# Patient Record
Sex: Female | Born: 1950 | Race: White | Hispanic: No | Marital: Married | Smoking: Never smoker
Health system: Southern US, Community
[De-identification: ages and names within clinical notes are randomized; demographics above are authoritative.]

## PROBLEM LIST (undated history)

## (undated) DIAGNOSIS — E119 Type 2 diabetes mellitus without complications: Secondary | ICD-10-CM

## (undated) HISTORY — PX: OTHER SURGICAL HISTORY: SHX169

## (undated) HISTORY — PX: ELBOW SURGERY: SHX618

## (undated) HISTORY — PX: BREAST SURGERY: SHX581

## (undated) HISTORY — PX: BACK SURGERY: SHX140

---

## 2016-03-11 ENCOUNTER — Emergency Department (HOSPITAL_COMMUNITY): Payer: Medicare Other

## 2016-03-11 ENCOUNTER — Emergency Department (HOSPITAL_COMMUNITY)
Admission: EM | Admit: 2016-03-11 | Discharge: 2016-03-11 | Disposition: A | Discharge status: Home / Self Care | Payer: Medicare Other | Attending: Emergency Medicine | Admitting: Emergency Medicine

## 2016-03-11 ENCOUNTER — Encounter (HOSPITAL_COMMUNITY): Payer: Self-pay | Admitting: *Deleted

## 2016-03-11 DIAGNOSIS — S0083XA Contusion of other part of head, initial encounter: Secondary | ICD-10-CM | POA: Diagnosis not present

## 2016-03-11 DIAGNOSIS — Y929 Unspecified place or not applicable: Secondary | ICD-10-CM | POA: Insufficient documentation

## 2016-03-11 DIAGNOSIS — E119 Type 2 diabetes mellitus without complications: Secondary | ICD-10-CM | POA: Diagnosis not present

## 2016-03-11 DIAGNOSIS — Y999 Unspecified external cause status: Secondary | ICD-10-CM | POA: Diagnosis not present

## 2016-03-11 DIAGNOSIS — W109XXA Fall (on) (from) unspecified stairs and steps, initial encounter: Secondary | ICD-10-CM | POA: Diagnosis not present

## 2016-03-11 DIAGNOSIS — S0990XA Unspecified injury of head, initial encounter: Secondary | ICD-10-CM

## 2016-03-11 DIAGNOSIS — Z79899 Other long term (current) drug therapy: Secondary | ICD-10-CM | POA: Diagnosis not present

## 2016-03-11 DIAGNOSIS — S52571A Other intraarticular fracture of lower end of right radius, initial encounter for closed fracture: Secondary | ICD-10-CM

## 2016-03-11 DIAGNOSIS — S0081XA Abrasion of other part of head, initial encounter: Secondary | ICD-10-CM

## 2016-03-11 DIAGNOSIS — W19XXXA Unspecified fall, initial encounter: Secondary | ICD-10-CM

## 2016-03-11 DIAGNOSIS — Y939 Activity, unspecified: Secondary | ICD-10-CM | POA: Insufficient documentation

## 2016-03-11 DIAGNOSIS — Z7984 Long term (current) use of oral hypoglycemic drugs: Secondary | ICD-10-CM | POA: Insufficient documentation

## 2016-03-11 DIAGNOSIS — S6991XA Unspecified injury of right wrist, hand and finger(s), initial encounter: Secondary | ICD-10-CM | POA: Diagnosis present

## 2016-03-11 HISTORY — DX: Type 2 diabetes mellitus without complications: E11.9

## 2016-03-11 MED ORDER — ONDANSETRON HCL 4 MG PO TABS
4.0000 mg | ORAL_TABLET | Freq: Once | ORAL | Status: AC
  Administered 2016-03-11: 4 mg via ORAL
  Filled 2016-03-11: qty 1

## 2016-03-11 MED ORDER — OXYCODONE-ACETAMINOPHEN 5-325 MG PO TABS
1.0000 | ORAL_TABLET | Freq: Once | ORAL | Status: AC
  Administered 2016-03-11: 1 via ORAL
  Filled 2016-03-11: qty 1

## 2016-03-11 NOTE — ED Provider Notes (Signed)
[facility]-EMERGENCY DEPT Provider Note   CSN: [id number] Arrival date & time: 03/11/16  1830     History   Chief Complaint Chief Complaint  Patient presents with  . Fall  . Head Injury  . Wrist Injury    HPI Heather Greene is a 65 y.o. female.  The history is provided by the patient.  Fall  This is a new problem. The current episode started 2 days ago. The problem has been resolved. Pertinent negatives include no chest pain, no abdominal pain, no headaches and no shortness of breath. Nothing aggravates the symptoms. Nothing relieves the symptoms.   Patient reports falling off a step 7 RV onto her face. No LOC. No anticoagulation. Patient with right periorbital abrasions. Also complaining of neck and right wrist pain.  Past Medical History:  Diagnosis Date  . Diabetes mellitus without complication (HCC)     There are no active problems to display for this patient.   Past Surgical History:  Procedure Laterality Date  . BACK SURGERY    . BREAST SURGERY     lumpectomy  . ELBOW SURGERY    . neck surgeries      OB History    No data available       Home Medications    Prior to Admission medications   Medication Sig Start Date End Date Taking? Authorizing Provider  albuterol (PROAIR HFA) 108 (90 Base) MCG/ACT inhaler Inhale 2 puffs into the lungs every 6 (six) hours as needed for wheezing or shortness of breath.   Yes Historical Provider, MD  Exenatide ER (BYDUREON) 2 MG PEN Inject into the skin every 7 (seven) days. (TUESDAYS)   Yes Historical Provider, MD  fluticasone (FLONASE) 50 MCG/ACT nasal spray Place 2 sprays into both nostrils daily as needed for allergies or rhinitis.   Yes Historical Provider, MD  Fluticasone Propionate, Inhal, (FLOVENT DISKUS) 250 MCG/BLIST AEPB Inhale 2 puffs into the lungs 2 (two) times daily as needed (when symptomatic).   Yes Historical Provider, MD  gabapentin (NEURONTIN) 600 MG tablet Take 600 mg by mouth 2 (two) times daily.   Yes  Historical Provider, MD  glycopyrrolate (ROBINUL) 1 MG tablet Take 1 mg by mouth 2 (two) times daily.   Yes Historical Provider, MD  HYDROcodone-acetaminophen (NORCO) 10-325 MG tablet Take 1 tablet by mouth 2 (two) times daily.   Yes Historical Provider, MD  levothyroxine (SYNTHROID, LEVOTHROID) 175 MCG tablet Take 175 mcg by mouth daily before breakfast.   Yes Historical Provider, MD  Lifitegrast Benay Spice) 5 % SOLN Place 1 drop into both eyes 2 (two) times daily.   Yes Historical Provider, MD  linaclotide (LINZESS) 290 MCG CAPS capsule Take 290 mcg by mouth daily before breakfast.   Yes Historical Provider, MD  metFORMIN (GLUCOPHAGE) 1000 MG tablet Take 1,000 mg by mouth 2 (two) times daily with a meal.   Yes Historical Provider, MD  methadone (DOLOPHINE) 10 MG tablet Take 10 mg by mouth every 12 (twelve) hours.   Yes Historical Provider, MD  metoprolol (LOPRESSOR) 50 MG tablet Take 50 mg by mouth 2 (two) times daily.   Yes Historical Provider, MD  mirabegron ER (MYRBETRIQ) 50 MG TB24 tablet Take 50 mg by mouth 2 (two) times daily.   Yes Historical Provider, MD  omeprazole (PRILOSEC) 40 MG capsule Take 40 mg by mouth daily before breakfast.   Yes Historical Provider, MD  polyethylene glycol (MIRALAX / GLYCOLAX) packet Take 17 g by mouth daily as needed for mild constipation.  Yes Historical Provider, MD  simvastatin (ZOCOR) 40 MG tablet Take 40 mg by mouth at bedtime.   Yes Historical Provider, MD  spironolactone (ALDACTONE) 25 MG tablet Take 25 mg by mouth every morning.    Yes Historical Provider, MD  tamoxifen (NOLVADEX) 10 MG tablet Take 10 mg by mouth 2 (two) times daily.   Yes Historical Provider, MD  trimethoprim (TRIMPEX) 100 MG tablet Take 100 mg by mouth daily.   Yes Historical Provider, MD  venlafaxine XR (EFFEXOR-XR) 150 MG 24 hr capsule Take 150 mg by mouth daily with breakfast.   Yes Historical Provider, MD    Family History No family history on file.  Social History Social  History  Substance Use Topics  . Smoking status: Never Smoker  . Smokeless tobacco: Never Used  . Alcohol use No     Allergies   Codeine; Demerol [meperidine]; Morphine and related; and Tape   Review of Systems Review of Systems  Respiratory: Negative for shortness of breath.   Cardiovascular: Negative for chest pain.  Gastrointestinal: Negative for abdominal pain.  Neurological: Negative for headaches.  Ten systems are reviewed and are negative for acute change except as noted in the HPI   Physical Exam Updated Vital Signs BP 106/82 (BP Location: Left Arm)   Pulse 72   Temp 97.7 F (36.5 C) (Oral)   Resp 18   SpO2 96%   Physical Exam  Constitutional: She is oriented to person, place, and time. She appears well-developed and well-nourished. No distress.  HENT:  Head: Normocephalic. Head is with abrasion and with contusion.    Right Ear: External ear normal.  Left Ear: External ear normal.  Nose: Nose normal.  Eyes: Conjunctivae and EOM are normal. Pupils are equal, round, and reactive to light. Right eye exhibits no discharge. Left eye exhibits no discharge. No scleral icterus.  Neck: Normal range of motion. Neck supple. Muscular tenderness present.  Cardiovascular: Normal rate, regular rhythm and normal heart sounds.  Exam reveals no gallop and no friction rub.   No murmur heard. Pulses:      Radial pulses are 2+ on the right side, and 2+ on the left side.       Dorsalis pedis pulses are 2+ on the right side, and 2+ on the left side.  Pulmonary/Chest: Effort normal and breath sounds normal. No stridor. No respiratory distress. She has no wheezes.     She exhibits tenderness.  Abdominal: Soft. She exhibits no distension. There is no tenderness.  Musculoskeletal: She exhibits no edema.       Right wrist: She exhibits tenderness, bony tenderness and swelling.       Cervical back: She exhibits no bony tenderness.       Thoracic back: She exhibits no bony  tenderness.       Lumbar back: She exhibits no bony tenderness.       Right hand: Normal sensation noted. Normal strength noted.  Clavicles stable. Chest stable to AP/Lat compression. Pelvis stable to Lat compression. No obvious extremity deformity. No chest or abdominal wall contusion.  No snuff box tenderness to right hand; NVI distally.   Neurological: She is alert and oriented to person, place, and time.  Moving all extremities  Skin: Skin is warm and dry. No rash noted. She is not diaphoretic. No erythema.  Psychiatric: She has a normal mood and affect.     ED Treatments / Results  Labs (all labs ordered are listed, but only abnormal results are  displayed) Labs Reviewed - No data to display  EKG  EKG Interpretation None       Radiology Dg Ribs Unilateral W/chest Right  Result Date: 03/11/2016 CLINICAL DATA:  Status post fall, with right lower posterior rib pain. Initial encounter. EXAM: RIGHT RIBS AND CHEST - 3+ VIEW COMPARISON:  None. FINDINGS: No displaced rib fractures are seen. The lungs are well-aerated. Minimal left basilar atelectasis is noted. There is no evidence of pleural effusion or pneumothorax. The cardiomediastinal silhouette is within normal limits. No acute osseous abnormalities are seen. Clips are noted within the right upper quadrant, reflecting prior cholecystectomy. IMPRESSION: No displaced rib fracture seen. Minimal left basilar atelectasis noted. Electronically Signed   By: Roanna RaiderJeffery  Chang M.D.   On: 03/11/2016 22:20   Dg Wrist Complete Right  Result Date: 03/11/2016 CLINICAL DATA:  Larey SeatFell down steps coming out of RV.  Wrist pain. EXAM: RIGHT WRIST - COMPLETE 3+ VIEW COMPARISON:  None. FINDINGS: Linear lucency through distal radius seen on single oblique view. No dislocation. Severe first carpometacarpal joint space narrowing, periarticular sclerosis and marginal spurring. No destructive bony lesions. Mild dorsal wrist soft tissue swelling without  subcutaneous gas or radiopaque foreign bodies. IMPRESSION: Possible nondisplaced acute distal radial fracture seen on single view. No dislocation. Severe first carpometacarpal osteoarthrosis. Electronically Signed   By: Awilda Metroourtnay  Bloomer M.D.   On: 03/11/2016 20:00   Ct Head Wo Contrast  Result Date: 03/11/2016 CLINICAL DATA:  Patient fell face first down stairs landing on the right side of face and head. Laceration over the right eye with bruising and swelling to the right eye. Neck pain all over. EXAM: CT HEAD WITHOUT CONTRAST CT MAXILLOFACIAL WITHOUT CONTRAST CT CERVICAL SPINE WITHOUT CONTRAST TECHNIQUE: Multidetector CT imaging of the head, cervical spine, and maxillofacial structures were performed using the standard protocol without intravenous contrast. Multiplanar CT image reconstructions of the cervical spine and maxillofacial structures were also generated. COMPARISON:  None. FINDINGS: CT HEAD FINDINGS Brain: Mild diffuse cerebral atrophy. No ventricular dilatation. Cavum septum pellucidum. Patchy low-attenuation changes in the deep white matter consistent with small vessel ischemia. No evidence of acute infarction, hemorrhage, hydrocephalus, extra-axial collection or mass lesion/mass effect. Vascular: No hyperdense vessel or unexpected calcification. Skull: Normal. Negative for fracture or focal lesion. Other: None. CT MAXILLOFACIAL FINDINGS Osseous: No fracture or mandibular dislocation. No destructive process. Orbits: Mild right periorbital soft tissue hematoma. No retrobulbar involvement. Globes and extraocular muscles appear intact and symmetrical. Sinuses: Paranasal sinuses are clear.  Mastoid air cells are clear. Soft tissues: Mild soft tissue swelling over the right side of the face. No discrete hematoma. Multiple dental reconstructions and dental extractions. CT CERVICAL SPINE FINDINGS Alignment: Normal alignment of the cervical spine and facet joints. Skull base and vertebrae: No acute  fracture. No primary bone lesion or focal pathologic process. Soft tissues and spinal canal: No prevertebral fluid or swelling. No visible canal hematoma. Disc levels: Postoperative changes in the cervical spine with anterior plate and screw fixation and intervertebral fusion from C3 through C7. 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 Prominent degenerative changes at C1 to. Degenerative changes at C6-7. Posterior osteophytes versus bone grafts from C4 through C6 cause effacement of the central canal. Degenerative changes throughout the facet joints. Upper chest: Coarse calcifications in the thyroid gland. Mild scarring in the lung apices. Other: None. IMPRESSION: No acute intracranial abnormalities. Chronic atrophy and small vessel ischemic changes. Right periorbital soft tissue hematoma. No acute displaced orbital or facial  fractures identified. Postoperative changes with anterior fixation and fusion from C3 through C7. Few segments appear intact. Diffuse degenerative changes throughout the cervical spine. No acute displaced fractures identified. Electronically Signed   By: Burman NievesWilliam  Stevens M.D.   On: 03/11/2016 22:21   Ct Cervical Spine Wo Contrast  Result Date: 03/11/2016 CLINICAL DATA:  Patient fell face first down stairs landing on the right side of face and head. Laceration over the right eye with bruising and swelling to the right eye. Neck pain all over. EXAM: CT HEAD WITHOUT CONTRAST CT MAXILLOFACIAL WITHOUT CONTRAST CT CERVICAL SPINE WITHOUT CONTRAST TECHNIQUE: Multidetector CT imaging of the head, cervical spine, and maxillofacial structures were performed using the standard protocol without intravenous contrast. Multiplanar CT image reconstructions of the cervical spine and maxillofacial structures were also generated. COMPARISON:  None. FINDINGS: CT HEAD FINDINGS Brain: Mild diffuse cerebral atrophy. No ventricular dilatation. Cavum septum pellucidum. Patchy  low-attenuation changes in the deep white matter consistent with small vessel ischemia. No evidence of acute infarction, hemorrhage, hydrocephalus, extra-axial collection or mass lesion/mass effect. Vascular: No hyperdense vessel or unexpected calcification. Skull: Normal. Negative for fracture or focal lesion. Other: None. CT MAXILLOFACIAL FINDINGS Osseous: No fracture or mandibular dislocation. No destructive process. Orbits: Mild right periorbital soft tissue hematoma. No retrobulbar involvement. Globes and extraocular muscles appear intact and symmetrical. Sinuses: Paranasal sinuses are clear.  Mastoid air cells are clear. Soft tissues: Mild soft tissue swelling over the right side of the face. No discrete hematoma. Multiple dental reconstructions and dental extractions. CT CERVICAL SPINE FINDINGS Alignment: Normal alignment of the cervical spine and facet joints. Skull base and vertebrae: No acute fracture. No primary bone lesion or focal pathologic process. Soft tissues and spinal canal: No prevertebral fluid or swelling. No visible canal hematoma. Disc levels: Postoperative changes in the cervical spine with anterior plate and screw fixation and intervertebral fusion from C3 through C7. 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 Prominent degenerative changes at C1 to. Degenerative changes at C6-7. Posterior osteophytes versus bone grafts from C4 through C6 cause effacement of the central canal. Degenerative changes throughout the facet joints. Upper chest: Coarse calcifications in the thyroid gland. Mild scarring in the lung apices. Other: None. IMPRESSION: No acute intracranial abnormalities. Chronic atrophy and small vessel ischemic changes. Right periorbital soft tissue hematoma. No acute displaced orbital or facial fractures identified. Postoperative changes with anterior fixation and fusion from C3 through C7. Few segments appear intact. Diffuse degenerative changes  throughout the cervical spine. No acute displaced fractures identified. Electronically Signed   By: Burman NievesWilliam  Stevens M.D.   On: 03/11/2016 22:21   Ct Maxillofacial Wo Contrast  Result Date: 03/11/2016 CLINICAL DATA:  Patient fell face first down stairs landing on the right side of face and head. Laceration over the right eye with bruising and swelling to the right eye. Neck pain all over. EXAM: CT HEAD WITHOUT CONTRAST CT MAXILLOFACIAL WITHOUT CONTRAST CT CERVICAL SPINE WITHOUT CONTRAST TECHNIQUE: Multidetector CT imaging of the head, cervical spine, and maxillofacial structures were performed using the standard protocol without intravenous contrast. Multiplanar CT image reconstructions of the cervical spine and maxillofacial structures were also generated. COMPARISON:  None. FINDINGS: CT HEAD FINDINGS Brain: Mild diffuse cerebral atrophy. No ventricular dilatation. Cavum septum pellucidum. Patchy low-attenuation changes in the deep white matter consistent with small vessel ischemia. No evidence of acute infarction, hemorrhage, hydrocephalus, extra-axial collection or mass lesion/mass effect. Vascular: No hyperdense vessel or unexpected calcification. Skull: Normal. Negative  for fracture or focal lesion. Other: None. CT MAXILLOFACIAL FINDINGS Osseous: No fracture or mandibular dislocation. No destructive process. Orbits: Mild right periorbital soft tissue hematoma. No retrobulbar involvement. Globes and extraocular muscles appear intact and symmetrical. Sinuses: Paranasal sinuses are clear.  Mastoid air cells are clear. Soft tissues: Mild soft tissue swelling over the right side of the face. No discrete hematoma. Multiple dental reconstructions and dental extractions. CT CERVICAL SPINE FINDINGS Alignment: Normal alignment of the cervical spine and facet joints. Skull base and vertebrae: No acute fracture. No primary bone lesion or focal pathologic process. Soft tissues and spinal canal: No prevertebral fluid or  swelling. No visible canal hematoma. Disc levels: Postoperative changes in the cervical spine with anterior plate and screw fixation and intervertebral fusion from C3 through C7. 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 Prominent degenerative changes at C1 to. Degenerative changes at C6-7. Posterior osteophytes versus bone grafts from C4 through C6 cause effacement of the central canal. Degenerative changes throughout the facet joints. Upper chest: Coarse calcifications in the thyroid gland. Mild scarring in the lung apices. Other: None. IMPRESSION: No acute intracranial abnormalities. Chronic atrophy and small vessel ischemic changes. Right periorbital soft tissue hematoma. No acute displaced orbital or facial fractures identified. Postoperative changes with anterior fixation and fusion from C3 through C7. Few segments appear intact. Diffuse degenerative changes throughout the cervical spine. No acute displaced fractures identified. Electronically Signed   By: Burman Nieves M.D.   On: 03/11/2016 22:21    Procedures Procedures (including critical care time)  Medications Ordered in ED Medications  oxyCODONE-acetaminophen (PERCOCET/ROXICET) 5-325 MG per tablet 1 tablet (1 tablet Oral Given 03/11/16 2227)  ondansetron (ZOFRAN) tablet 4 mg (4 mg Oral Given 03/11/16 2227)     Initial Impression / Assessment and Plan / ED Course  I have reviewed the triage vital signs and the nursing notes.  Pertinent labs & imaging results that were available during my care of the patient were reviewed by me and considered in my medical decision making (see chart for details).  Clinical Course     CT head, face, cervical spine without acute fractures. Plain film of the right red without acute fracture. Plain film of the right wrist with likely distal radius intra-articular fracture. No snuffbox tenderness concerning for scaphoid fracture. Neurovascularly intact distally. Placed in  a sugar tong splint.  Will have patient follow-up with orthopedist back home in [place].  Final Clinical Impressions(s) / ED Diagnoses   Final diagnoses:  Other closed intra-articular fracture of distal end of right radius, initial encounter  Fall, initial encounter  Abrasion of face, initial encounter  Injury of head, initial encounter  Contusion of face, initial encounter   Disposition: Discharge  Condition: Good  I have discussed the results, Dx and Tx plan with the patient who expressed understanding and agree(s) with the plan. Discharge instructions discussed at great length. The patient was given strict return precautions who verbalized understanding of the instructions. No further questions at time of discharge.    Discharge Medication List as of 03/11/2016 11:25 PM      Follow Up: Orthopedic surgery  Schedule an appointment as soon as possible for a visit in 1 week For close follow up to assess for wrist fracture      Nira Conn, MD 03/12/16 [phone]

## 2016-03-11 NOTE — ED Triage Notes (Signed)
PT tripped fell out of RV door and landed on right side and no LOC.  Pt has right orbital bruising and swelling and right wrist injury with some redness.  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

## 2016-03-11 NOTE — Progress Notes (Signed)
Orthopedic Tech Progress Note Patient Details:  Heather Greene [birth year] [id number]  Ortho Devices Type of Ortho Device: Arm sling, Sugartong splint, Ace wrap Ortho Device/Splint Location: RUE Ortho Device/Splint Interventions: Ordered, Application   Jennye MoccasinHughes, Heather Greene 03/11/2016, 11:11 PM

## 2018-06-12 IMAGING — DX DG RIBS W/ CHEST 3+V*R*
4 series · 4 of 4 positions shown · non-contrast
Comparison: None.

CLINICAL DATA: Status post fall, with right lower posterior rib
pain. Initial encounter.

EXAM:
RIGHT RIBS AND CHEST - 3+ VIEW

[w chest pa]
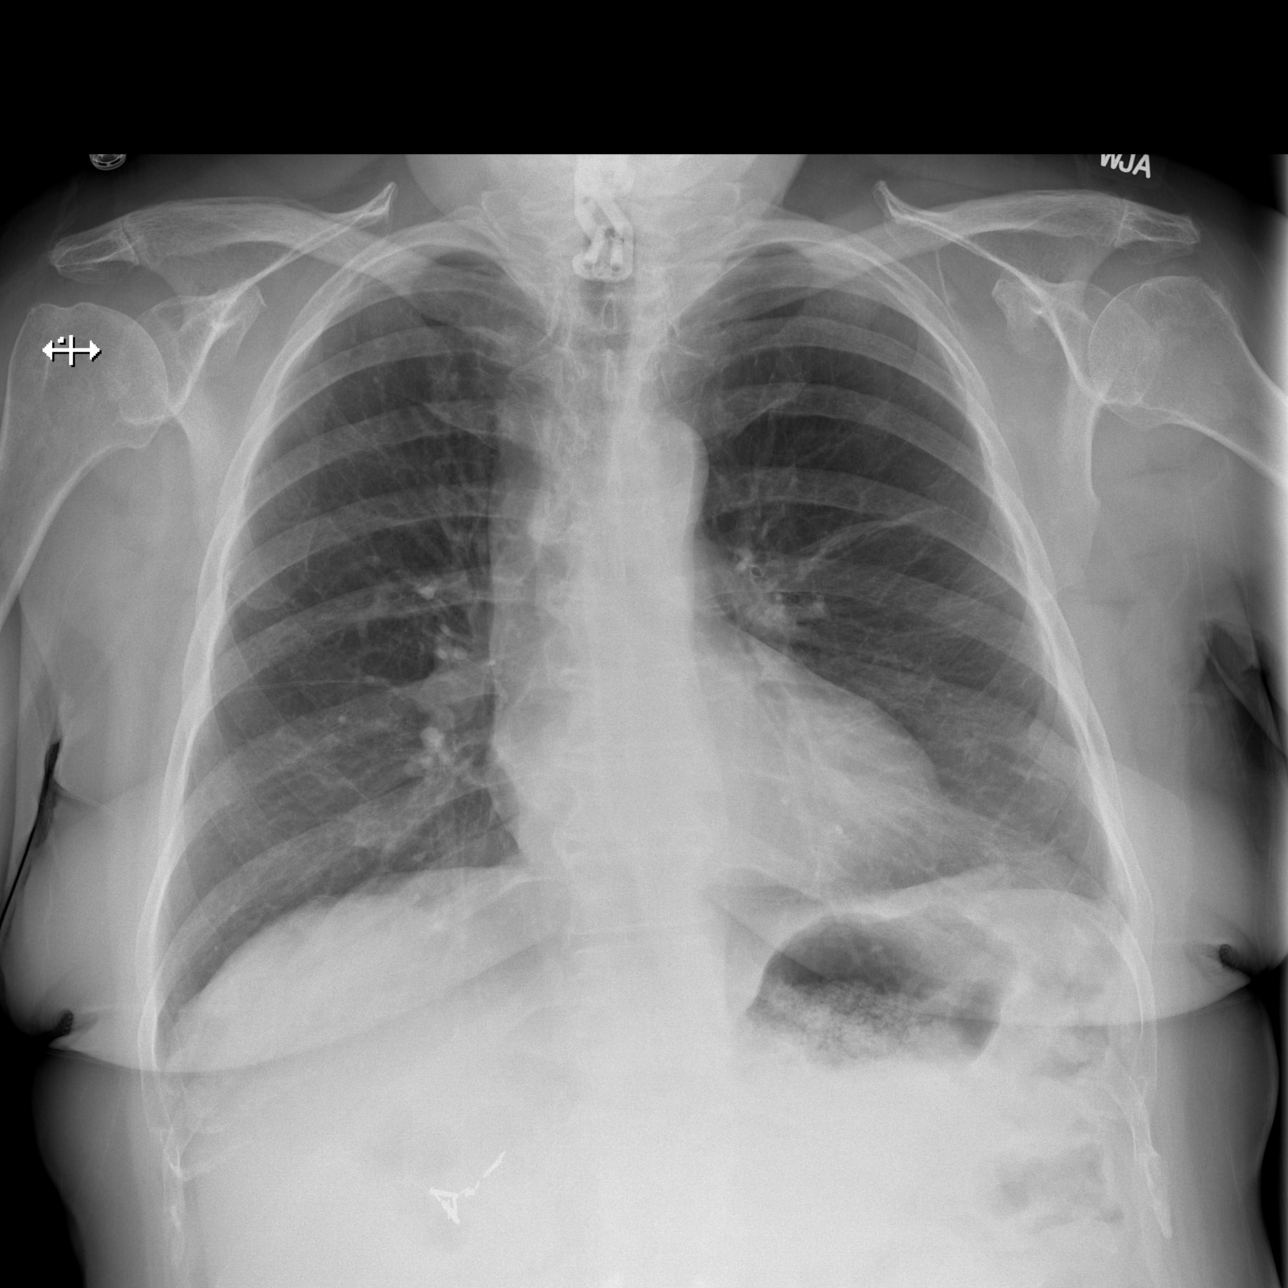

[w ribs ap upper right]
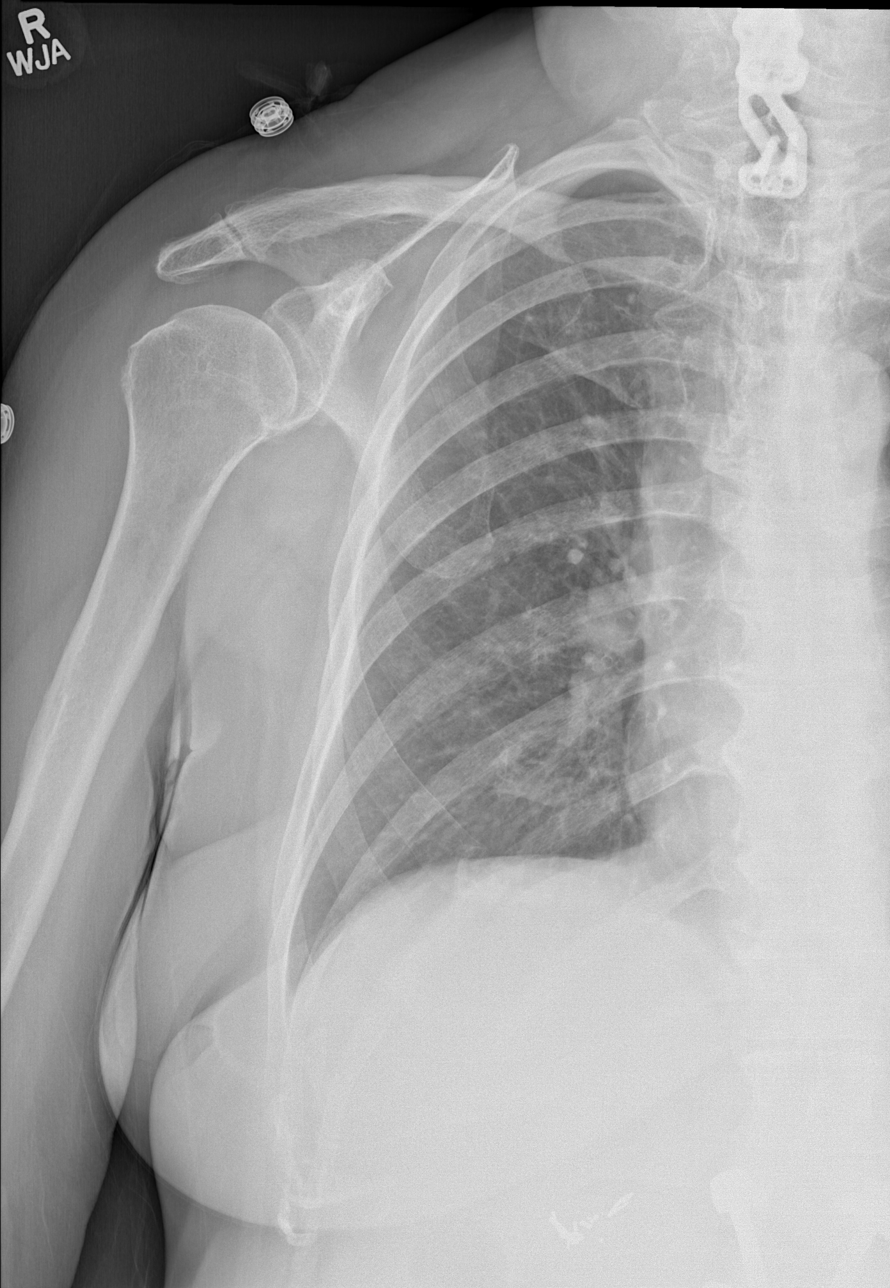

[w ribs ap lower right]
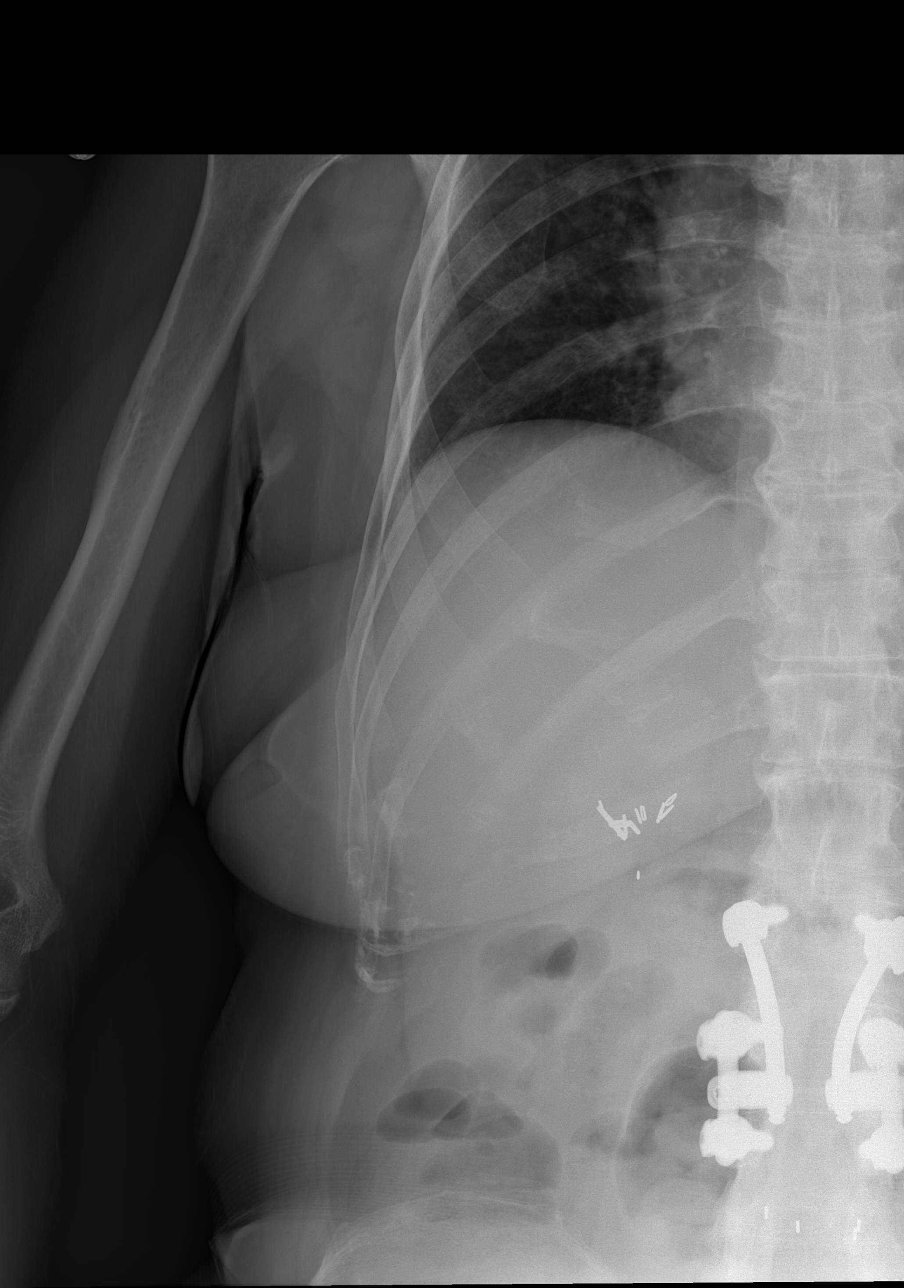

[w ribs obl right]
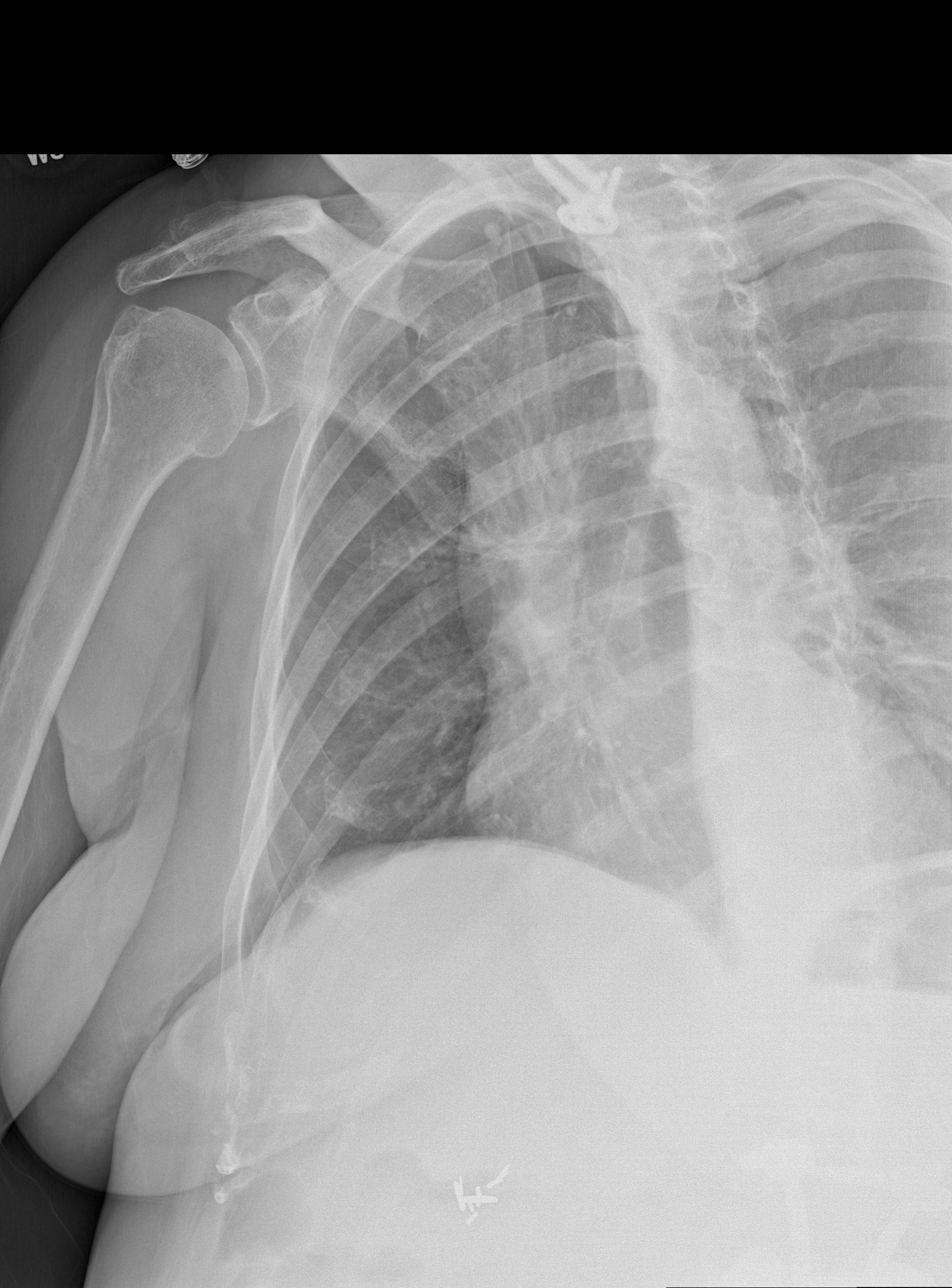

[4 of 4 positions shown; findings below may reference images not displayed]

FINDINGS: No displaced rib fractures are seen.

The lungs are well-aerated. Minimal left basilar atelectasis is
noted. There is no evidence of pleural effusion or pneumothorax.

The cardiomediastinal silhouette is within normal limits. No acute
osseous abnormalities are seen. Clips are noted within the right
upper quadrant, reflecting prior cholecystectomy.
IMPRESSION: No displaced rib fracture seen. Minimal left basilar atelectasis
noted.
